# Patient Record
Sex: Female | Born: 1935 | Race: White | Hispanic: No | Smoking: Never smoker
Health system: Southern US, Community
[De-identification: ages and names within clinical notes are randomized; demographics above are authoritative.]

## PROBLEM LIST (undated history)

## (undated) DIAGNOSIS — I10 Essential (primary) hypertension: Secondary | ICD-10-CM

## (undated) DIAGNOSIS — I639 Cerebral infarction, unspecified: Secondary | ICD-10-CM

## (undated) HISTORY — PX: TONSILLECTOMY: SUR1361

## (undated) HISTORY — PX: ABDOMINAL HYSTERECTOMY: SHX81

---

## 2016-08-28 ENCOUNTER — Emergency Department (HOSPITAL_BASED_OUTPATIENT_CLINIC_OR_DEPARTMENT_OTHER)
Admission: EM | Admit: 2016-08-28 | Discharge: 2016-08-28 | Disposition: A | Discharge status: Home / Self Care | Payer: Medicare Other | Attending: Emergency Medicine | Admitting: Emergency Medicine

## 2016-08-28 ENCOUNTER — Encounter (HOSPITAL_BASED_OUTPATIENT_CLINIC_OR_DEPARTMENT_OTHER): Payer: Self-pay | Admitting: Emergency Medicine

## 2016-08-28 ENCOUNTER — Emergency Department (HOSPITAL_BASED_OUTPATIENT_CLINIC_OR_DEPARTMENT_OTHER): Payer: Medicare Other

## 2016-08-28 DIAGNOSIS — Y999 Unspecified external cause status: Secondary | ICD-10-CM | POA: Diagnosis not present

## 2016-08-28 DIAGNOSIS — I10 Essential (primary) hypertension: Secondary | ICD-10-CM | POA: Diagnosis not present

## 2016-08-28 DIAGNOSIS — Z79899 Other long term (current) drug therapy: Secondary | ICD-10-CM | POA: Diagnosis not present

## 2016-08-28 DIAGNOSIS — S80211A Abrasion, right knee, initial encounter: Secondary | ICD-10-CM | POA: Insufficient documentation

## 2016-08-28 DIAGNOSIS — S80212A Abrasion, left knee, initial encounter: Secondary | ICD-10-CM | POA: Diagnosis not present

## 2016-08-28 DIAGNOSIS — Y939 Activity, unspecified: Secondary | ICD-10-CM | POA: Insufficient documentation

## 2016-08-28 DIAGNOSIS — Z23 Encounter for immunization: Secondary | ICD-10-CM | POA: Diagnosis not present

## 2016-08-28 DIAGNOSIS — S0083XA Contusion of other part of head, initial encounter: Secondary | ICD-10-CM

## 2016-08-28 DIAGNOSIS — W19XXXA Unspecified fall, initial encounter: Secondary | ICD-10-CM

## 2016-08-28 DIAGNOSIS — Y92009 Unspecified place in unspecified non-institutional (private) residence as the place of occurrence of the external cause: Secondary | ICD-10-CM | POA: Diagnosis not present

## 2016-08-28 DIAGNOSIS — W01198A Fall on same level from slipping, tripping and stumbling with subsequent striking against other object, initial encounter: Secondary | ICD-10-CM | POA: Diagnosis not present

## 2016-08-28 DIAGNOSIS — T148XXA Other injury of unspecified body region, initial encounter: Secondary | ICD-10-CM

## 2016-08-28 DIAGNOSIS — S0990XA Unspecified injury of head, initial encounter: Secondary | ICD-10-CM | POA: Diagnosis present

## 2016-08-28 HISTORY — DX: Cerebral infarction, unspecified: I63.9

## 2016-08-28 HISTORY — DX: Essential (primary) hypertension: I10

## 2016-08-28 MED ORDER — TETANUS-DIPHTH-ACELL PERTUSSIS 5-2.5-18.5 LF-MCG/0.5 IM SUSP
0.5000 mL | Freq: Once | INTRAMUSCULAR | Status: AC
  Administered 2016-08-28: 0.5 mL via INTRAMUSCULAR
  Filled 2016-08-28: qty 0.5

## 2016-08-28 NOTE — ED Provider Notes (Signed)
[facility] Provider Note   CSN: [id number] Arrival date & time: 08/28/16  1400     History   Chief Complaint Chief Complaint  Patient presents with  . Fall    HPI Cassandra Peterson is a 83 y.o. female.  Patient fell outside on her steps at 2:00 C afternoon. No loss of consciousness. Patient is on Plavix but no other blood thinners. Patient landed on her face as well as hands and knees. Main complaint is laceration to her nose. Patient's tetanus is not up-to-date. Patient denies any neck pain or back pain abdominal pain chest pain trouble breathing. No significant headache. Denies any significant pain to her extremities. Patient able to ambulate without significant difficulty.      Past Medical History:  Diagnosis Date  . Hypertension   . Stroke [facility])     There are no active problems to display for this patient.   Past Surgical History:  Procedure Laterality Date  . ABDOMINAL HYSTERECTOMY    . TONSILLECTOMY      OB History    No data available       Home Medications    Prior to Admission medications   Medication Sig Start Date End Date Taking? Authorizing Provider  allopurinol (ZYLOPRIM) 100 MG tablet Take 100 mg by mouth daily.   Yes Historical Provider, MD  clopidogrel (PLAVIX) 75 MG tablet Take 75 mg by mouth daily.   Yes Historical Provider, MD    Family History No family history on file.  Social History Social History  Substance Use Topics  . Smoking status: Never Smoker  . Smokeless tobacco: Never Used  . Alcohol use No     Allergies   Patient has no known allergies.   Review of Systems Review of Systems  Constitutional: Negative for fever.  HENT: Negative for congestion.   Eyes: Negative for pain and redness.  Respiratory: Negative for shortness of breath.   Cardiovascular: Negative for chest pain.  Gastrointestinal: Negative for abdominal pain, nausea and vomiting.  Genitourinary: Negative for hematuria.  Musculoskeletal:  Negative for back pain and neck pain.  Skin: Positive for wound.  Neurological: Negative for syncope and headaches.  Hematological: Does not bruise/bleed easily.  Psychiatric/Behavioral: Negative for confusion.     Physical Exam Updated Vital Signs BP 149/72 (BP Location: Right Arm)   Pulse 69   Temp 97.6 F (36.4 C)   Resp 18   SpO2 98%   Physical Exam  Constitutional: She is oriented to person, place, and time. She appears well-developed and well-nourished. No distress.  HENT:  Head: Normocephalic.  U-shaped laceration to the bridge of the nose measuring 2 cm. Wound sealed not actively bleeding. No internal nose bleeding. Some swelling to the nose but no obvious deformity. Abrasion to the left tip of the nose measuring about 5 mm. Some bruising under both eyes. Some bruising to the left for head area.  Eyes: Conjunctivae and EOM are normal. Pupils are equal, round, and reactive to light.  Neck: Normal range of motion. Neck supple.  Neck: Nontender to palpation posteriorly. Good range of motion.  Cardiovascular: Normal rate and regular rhythm.   Pulmonary/Chest: Effort normal and breath sounds normal.  Abdominal: Soft. Bowel sounds are normal. There is no tenderness.  Musculoskeletal: Normal range of motion.  Back nontender. Good range of motion of all extremities. In particular good range of motion at the hip. Bilateral abrasions to the knees. Scattered abrasions to both hands.  Neurological: She is alert and  oriented to person, place, and time. No cranial nerve deficit or sensory deficit. She exhibits normal muscle tone. Coordination normal.  Skin: Skin is warm.  Nursing note and vitals reviewed.    ED Treatments / Results  Labs (all labs ordered are listed, but only abnormal results are displayed) Labs Reviewed - No data to display  EKG  EKG Interpretation None       Radiology Ct Head Wo Contrast  Result Date: 08/28/2016 CLINICAL DATA:  Fall.  Initial encounter.  EXAM: CT HEAD WITHOUT CONTRAST TECHNIQUE: Contiguous axial images were obtained from the base of the skull through the vertex without intravenous contrast. COMPARISON:  CT head 06/11/2014 FINDINGS: Brain: Moderate atrophy with progression in the interval. Moderate chronic microvascular ischemic changes in the white matter, similar to the prior study. Negative for acute infarct. Negative for acute hemorrhage. CSF density cyst in the left parietal region. This appears extra-axial and appears to be lined by gray matter suggesting arachnoid cyst. This extends into a sulcus and measures approximately 3 x 4 cm. No change from the prior CT. Vascular: No hyperdense vessel or unexpected calcification. Skull: Negative Sinuses/Orbits: Negative Other: None IMPRESSION: Atrophy with chronic microvascular ischemic change in the white matter. Arachnoid cyst left parietal regions stable No acute intracranial abnormality. Electronically Signed   By: Marlan Palauharles  Clark M.D.   On: 08/28/2016 15:04   Ct Maxillofacial Wo Contrast  Result Date: 08/28/2016 CLINICAL DATA:  Fall.  Initial encounter. EXAM: CT HEAD WITHOUT CONTRAST TECHNIQUE: Contiguous axial images were obtained from the base of the skull through the vertex without intravenous contrast. COMPARISON:  CT head 06/11/2014 FINDINGS: Brain: Moderate atrophy with progression in the interval. Moderate chronic microvascular ischemic changes in the white matter, similar to the prior study. Negative for acute infarct. Negative for acute hemorrhage. CSF density cyst in the left parietal region. This appears extra-axial and appears to be lined by gray matter suggesting arachnoid cyst. This extends into a sulcus and measures approximately 3 x 4 cm. No change from the prior CT. Vascular: No hyperdense vessel or unexpected calcification. Skull: Negative Sinuses/Orbits: Negative Other: None IMPRESSION: Atrophy with chronic microvascular ischemic change in the white matter. Arachnoid cyst left  parietal regions stable No acute intracranial abnormality. Electronically Signed   By: Marlan Palauharles  Clark M.D.   On: 08/28/2016 15:04    Procedures Procedures (including critical care time)  Medications Ordered in ED Medications  Tdap (BOOSTRIX) injection 0.5 mL (not administered)     Initial Impression / Assessment and Plan / ED Course  I have reviewed the triage vital signs and the nursing notes.  Pertinent labs & imaging results that were available during my care of the patient were reviewed by me and considered in my medical decision making (see chart for details).     Patient status post fall 2:00 in the afternoon no loss of consciousness. But did strike her face as well as abrasions to her hands and her knees. Main complaint was the nose which did a fair amount of bleeding. Patient not on blood thinners other than Plavix.  Head CT and CT face without any bony or brain injury  No suturing required. U-shaped bridge of the nose laceration is closed and sealed. Abrasion to the tip of the nose. No internal bleeding on the nose. Abrasions to both hands and abrasions to both knees. But no obvious concerns for any other bony injuries moves legs well at the hips. No neck pain no back pain. No abdominal pain  no chest pain.  Patiently treated with local wound care and antibiotic ointment. Tetanus updated.  Final Clinical Impressions(s) / ED Diagnoses   Final diagnoses:  Fall, initial encounter  Abrasion  Contusion of face, initial encounter    New Prescriptions New Prescriptions   No medications on file     Vanetta Mulders, MD 08/28/16 [phone]

## 2016-08-28 NOTE — ED Notes (Signed)
ED Provider at bedside. 

## 2016-08-28 NOTE — ED Triage Notes (Addendum)
Pt tripped and fell outside at home striking her face on brick patio. Pt has lacerations to nose, denies LOC, pt takes plavix. Pt also reports bilat knee pain.

## 2016-08-28 NOTE — Discharge Instructions (Signed)
Local wound care to the nose wounds with antibiotic ointment twice a day. If it starts to bleed some just have it gently. Return for any new or worse symptoms. Tetanus updated today.

## 2016-08-28 NOTE — ED Notes (Signed)
Patient transported to CT 

## 2017-12-30 IMAGING — CT CT MAXILLOFACIAL W/O CM
4 of 7 series · 16 of 47 positions shown, 18 images · non-contrast
Comparison: CT head 06/11/2014

CLINICAL DATA: Fall.  Initial encounter.

EXAM:
CT HEAD WITHOUT CONTRAST
TECHNIQUE: Contiguous axial images were obtained from the base of the skull
through the vertex without intravenous contrast.

[Series 2: head wo · axial · 0.38mm/px · z∈[-122,-22]mm · 6 of 29 slices shown, 8 images]
[im 5/29  brain]
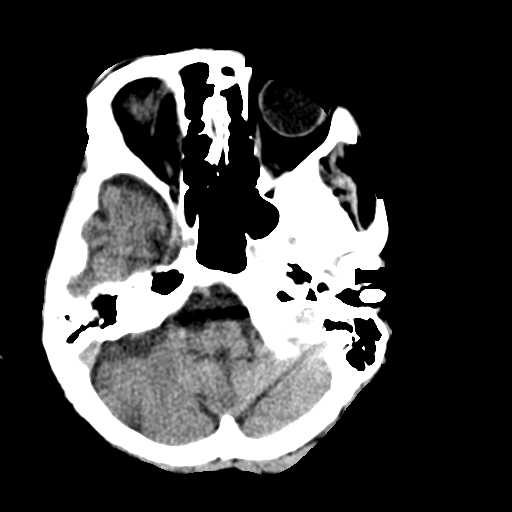
[im 5/29  bone]
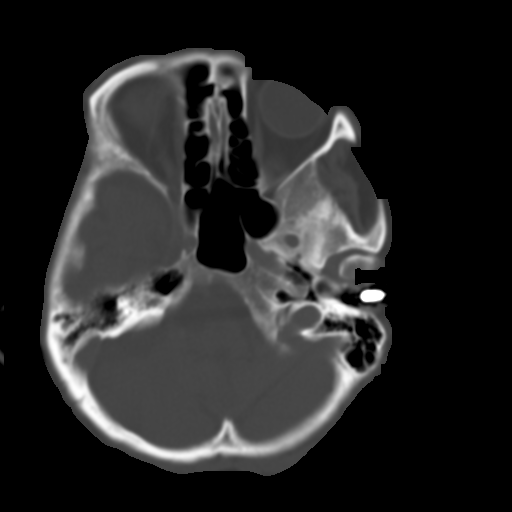
[im 9/29  bone]
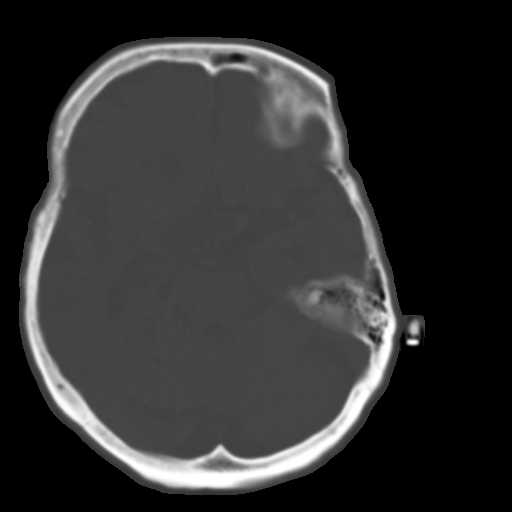
[im 13/29  bone]
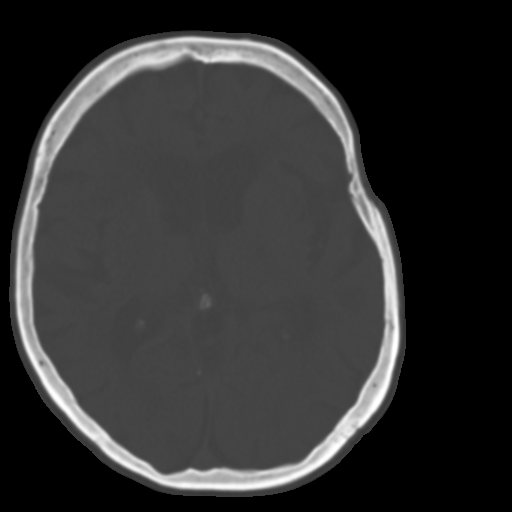
[im 17/29  bone]
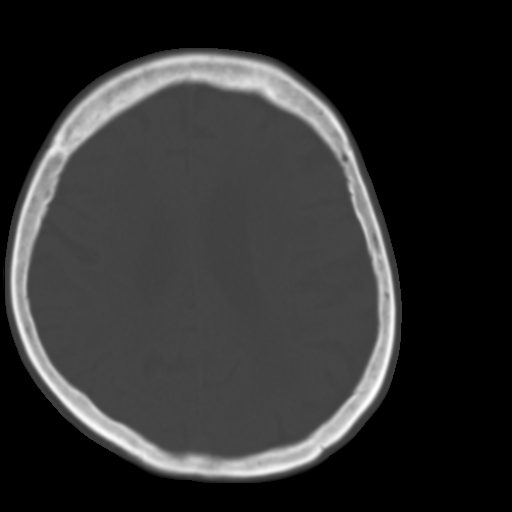
[im 21/29  brain]
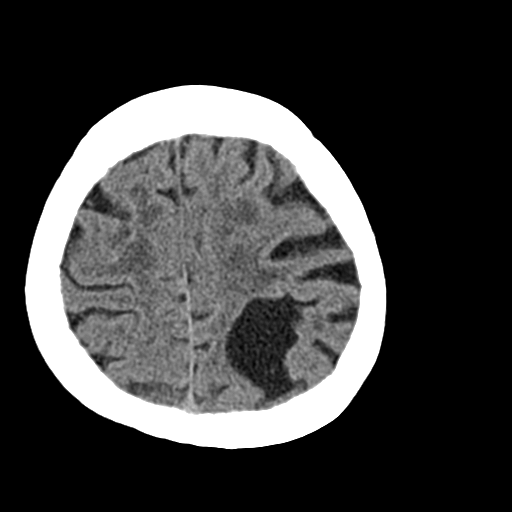
[im 21/29  bone]
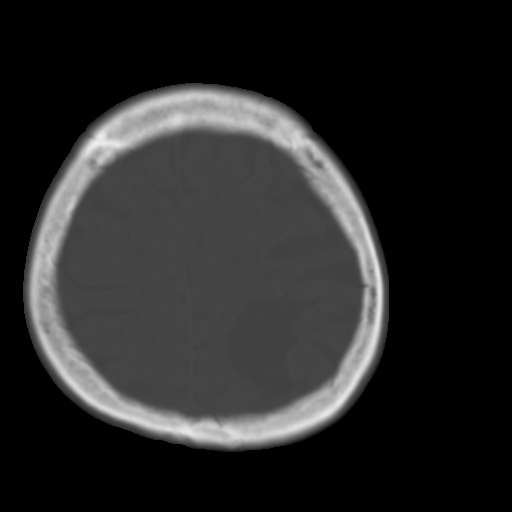
[im 25/29  bone]
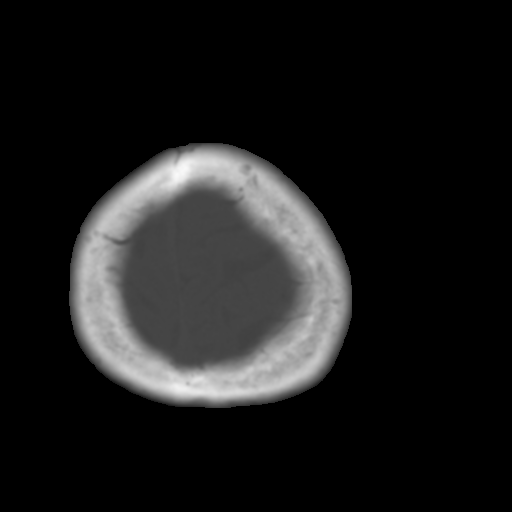

[Series 4: cor head wo · coronal · 0.29mm/px · 3 of 63 slices shown]
[im 16/63  bone]
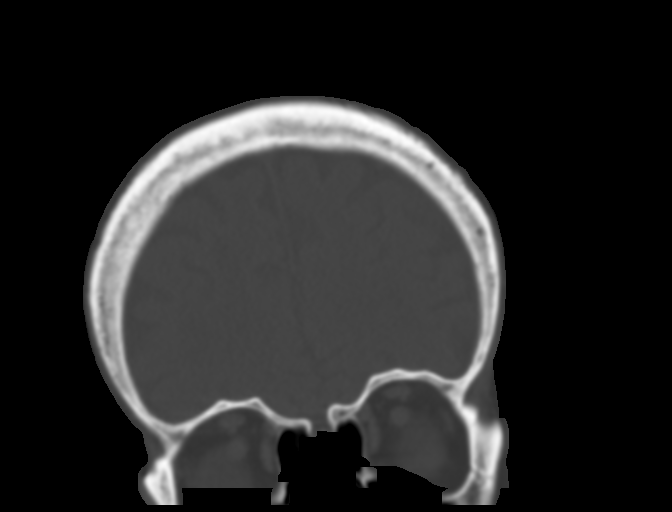
[im 32/63  bone]
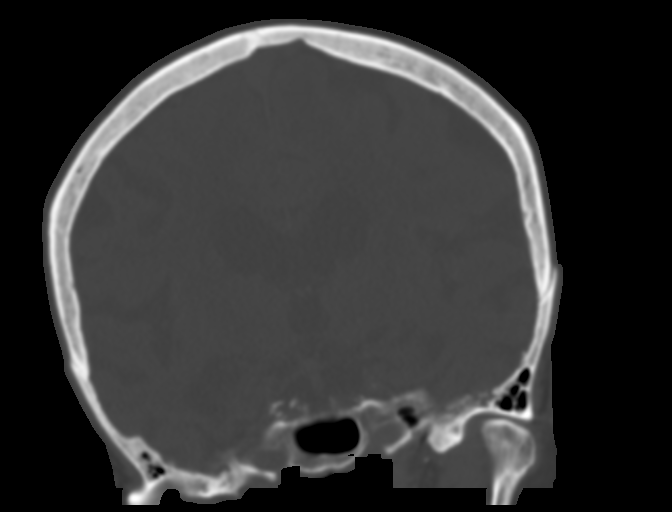
[im 47/63  bone]
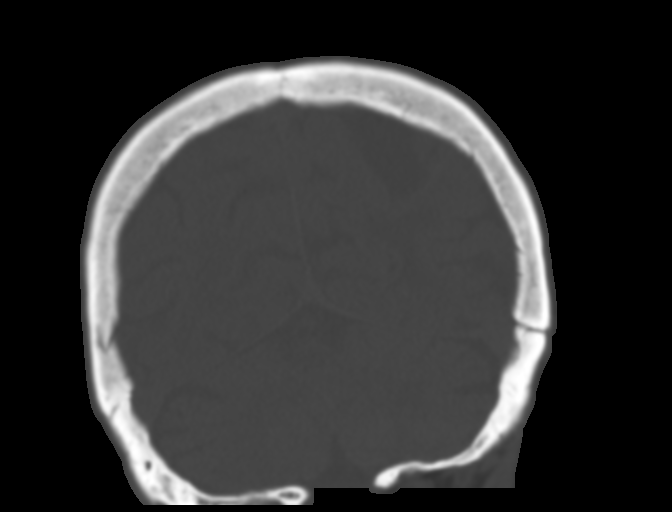

[Series 6: max soft · axial · 0.26mm/px · z∈[-244,-148]mm · 6 of 85 slices shown]
[im 9/85  brain]
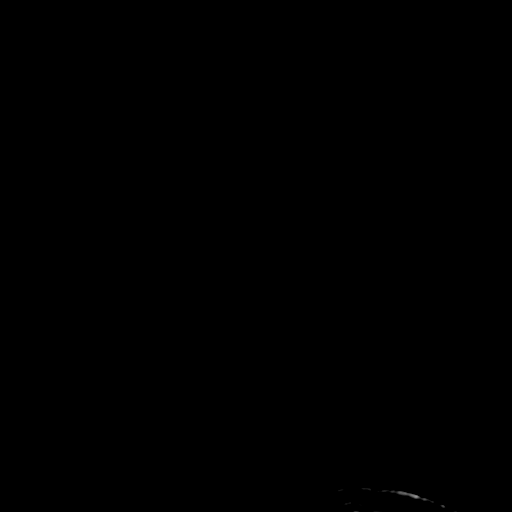
[im 17/85  brain]
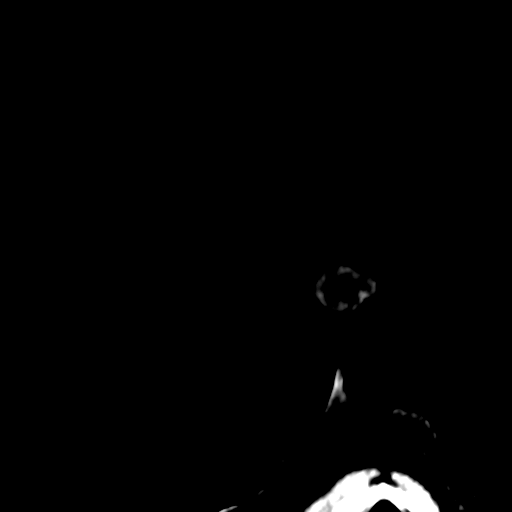
[im 29/85  brain]
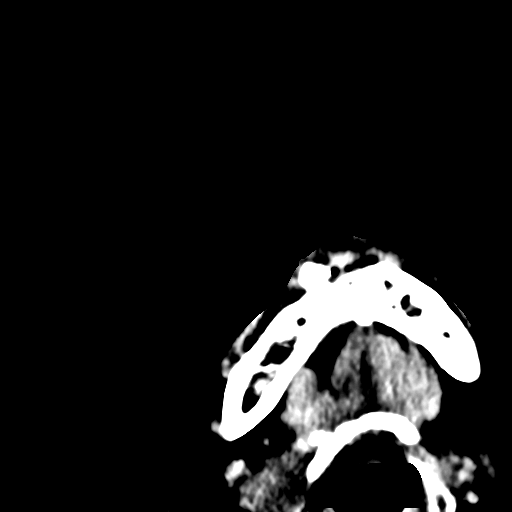
[im 37/85  brain]
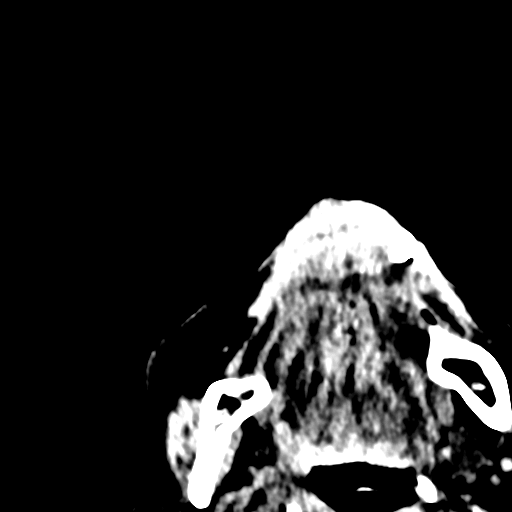
[im 49/85  brain]
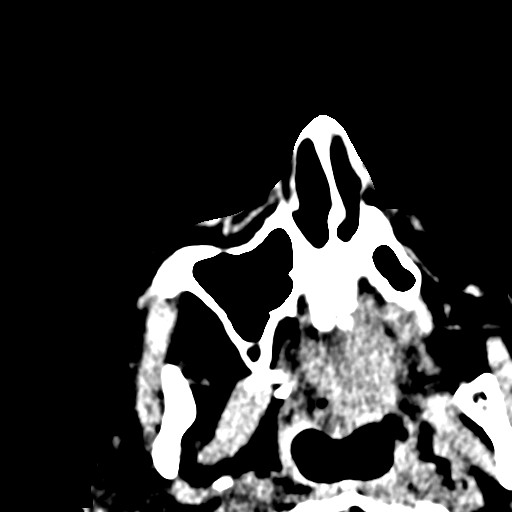
[im 57/85  brain]
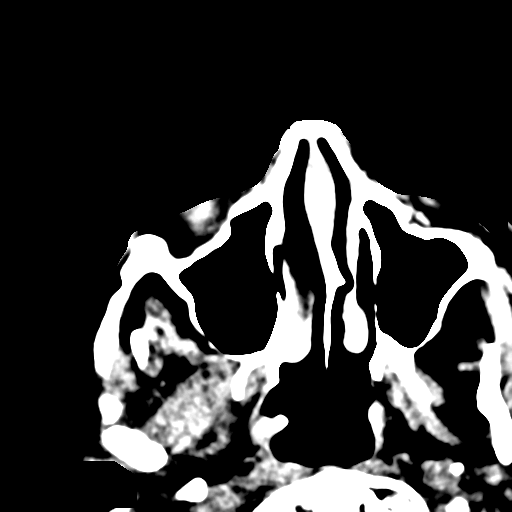

[Series 13: sagittal bone · sagittal · 0.27mm/px · 1 of 80 slices shown]
[im 40/80  bone]
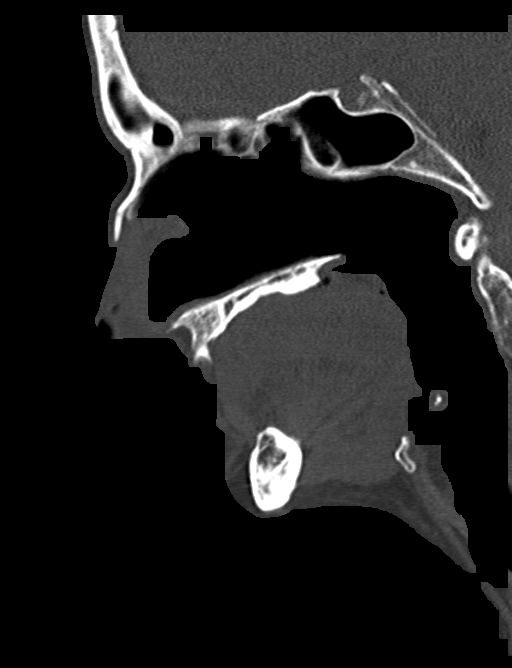

[16 of 47 positions shown; findings below may reference images not displayed]

FINDINGS: Brain: Moderate atrophy with progression in the interval. Moderate
chronic microvascular ischemic changes in the white matter, similar
to the prior study. Negative for acute infarct. Negative for acute
hemorrhage.

CSF density cyst in the left parietal region. This appears
extra-axial and appears to be lined by gray matter suggesting
arachnoid cyst. This extends into a sulcus and measures
approximately 3 x 4 cm. No change from the prior CT.

Vascular: No hyperdense vessel or unexpected calcification.

Skull: Negative

Sinuses/Orbits: Negative

Other: None
IMPRESSION: Atrophy with chronic microvascular ischemic change in the white
matter.

Arachnoid cyst left parietal regions stable

No acute intracranial abnormality.

## 2020-09-30 DEATH — deceased
